# Patient Record
Sex: Male | Born: 1995 | Race: Black or African American | Hispanic: No | Marital: Single
Health system: Southern US, Community
[De-identification: ages and names within clinical notes are randomized; demographics above are authoritative.]

---

## 2016-12-29 ENCOUNTER — Ambulatory Visit
Admission: RE | Admit: 2016-12-29 | Discharge: 2016-12-29 | Disposition: A | Discharge status: Home / Self Care | Payer: Federal, State, Local not specified - PPO | Source: Ambulatory Visit | Attending: Family Medicine | Admitting: Family Medicine

## 2016-12-29 ENCOUNTER — Other Ambulatory Visit: Payer: Self-pay | Admitting: Family Medicine

## 2016-12-29 DIAGNOSIS — M47812 Spondylosis without myelopathy or radiculopathy, cervical region: Secondary | ICD-10-CM | POA: Diagnosis not present

## 2016-12-29 DIAGNOSIS — M2578 Osteophyte, vertebrae: Secondary | ICD-10-CM | POA: Insufficient documentation

## 2016-12-29 DIAGNOSIS — M542 Cervicalgia: Secondary | ICD-10-CM | POA: Diagnosis present

## 2016-12-29 DIAGNOSIS — R52 Pain, unspecified: Secondary | ICD-10-CM

## 2016-12-30 ENCOUNTER — Ambulatory Visit
Admission: RE | Admit: 2016-12-30 | Discharge: 2016-12-30 | Disposition: A | Discharge status: Home / Self Care | Payer: Federal, State, Local not specified - PPO | Source: Ambulatory Visit | Attending: Family Medicine | Admitting: Family Medicine

## 2016-12-30 ENCOUNTER — Other Ambulatory Visit: Payer: Self-pay | Admitting: Family Medicine

## 2016-12-30 ENCOUNTER — Ambulatory Visit (INDEPENDENT_AMBULATORY_CARE_PROVIDER_SITE_OTHER): Payer: Federal, State, Local not specified - PPO | Admitting: Family Medicine

## 2016-12-30 DIAGNOSIS — M4802 Spinal stenosis, cervical region: Secondary | ICD-10-CM | POA: Diagnosis not present

## 2016-12-30 DIAGNOSIS — M5030 Other cervical disc degeneration, unspecified cervical region: Secondary | ICD-10-CM | POA: Diagnosis present

## 2016-12-30 DIAGNOSIS — M50222 Other cervical disc displacement at C5-C6 level: Secondary | ICD-10-CM | POA: Diagnosis not present

## 2016-12-30 DIAGNOSIS — M542 Cervicalgia: Secondary | ICD-10-CM | POA: Diagnosis not present

## 2016-12-30 MED ORDER — NAPROXEN 500 MG PO TABS: 500.0000 mg | ORAL_TABLET | Freq: Two times a day (BID) | ORAL | 0 refills | Status: AC

## 2016-12-30 MED ORDER — CYCLOBENZAPRINE HCL 5 MG PO TABS: 5.0000 mg | ORAL_TABLET | Freq: Two times a day (BID) | ORAL | 0 refills | Status: AC | PRN

## 2017-01-19 NOTE — Progress Notes (Signed)
Patient presents today for symptoms of neck pain. Patient states that he developed neck pain and decreased range of motion the night after the [employer] football game. Patient remembers getting tackled from the left side and falling to the ground. He states that he also twisted his ankle in the process. He believes that his extremities went numb for a few seconds before the training staff came out to the field. He denied any headache or neck pain out on the field. When he was evaluated by myself, Dr. Ardine Engiehl and the training staff he was just complaining about his left ankle. Patient had no neck tenderness, full sensation and strength of all extremities prior to getting up and going to the sidelines. Patient was further evaluated on the sidelines for a neck injury/concussion. Patient had no neck pain or concussive symptoms on sideline evaluation. Patient continued to have some left ankle pain and was unable to return back to the game. Patient reported to the training staff in the morning with neck discomfort and decreased range of motion. Patient denies any headache, blurry vision, nausea, vomiting, decreased strength of extremities or radicular symptoms of extremities. He states that on occasion after a football game in the past he would have neck discomfort but he would not have pain with range of motion as he does now. His ankle symptoms have improved and has minimal pain now and has stopped using the walking boot and is now walking in a tennis shoe.  ROS: Negative except mentioned above.  GENERAL: NAD, making good eye contact, normal affect HEENT: PERRL, EOMI RESP: CTA B CARD: RRR MSK: Mild paravertebral tenderness lower cervical spine/upper thoracic area, mild spasm in this area, mildly decreased flexion due to pain, normal side to side rotation, negative Spurling's, 5 out of 5 strength of upper and lower extremities, normal reflexes NEURO: CN II-XII grossly intact, negative Romberg's, no nystagmus  appreciated  A/P: Neck pain/spasm, C-spine DJD - given patient's symptoms above and C-spine x-ray results would recommend doing an MRI of the C-spine, will then have patient evaluated by Dr. Ardine Engiehl. Will prescribe patient anti-inflammatory medication and muscle relaxant. Discussed doing range of motion exercises with patient and trainer. Ice/heat when necessary. Would not recommend any physical activity for now until MRI C-spine results have been reviewed. Patient states that he will discuss our conversation today with his parents. I told him I would be happy to talk to them if he requested.

## 2018-06-13 IMAGING — MR MR CERVICAL SPINE W/O CM
5 series · 33 of 48 positions shown · non-contrast
Comparison: Cervical spine radiograph 12/29/2016

CLINICAL DATA: Football injury

EXAM:
MRI CERVICAL SPINE WITHOUT CONTRAST
TECHNIQUE: Multiplanar, multisequence MR imaging of the cervical spine was
performed. No intravenous contrast was administered.

[Series 3: T2 · sagittal · 3.0mm · 0.70mm/px · 6 of 13 slices shown (1 of 2)]
[im 1/13]
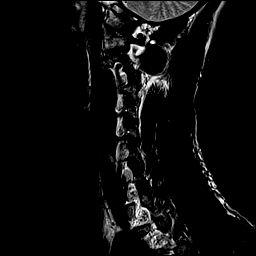
[im 3/13]
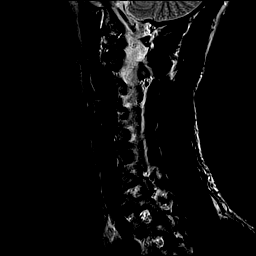
[im 5/13]
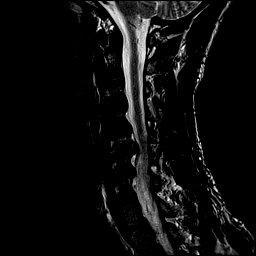
[im 8/13]
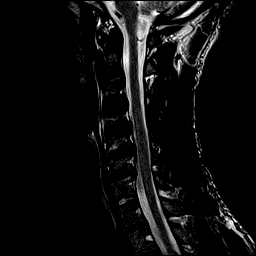
[im 10/13]
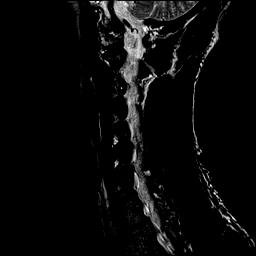
[im 13/13]
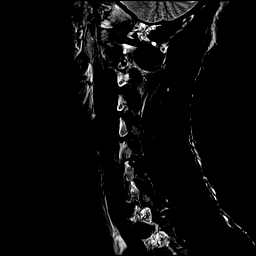

[Series 4: T1 · sagittal · 3.0mm · 0.70mm/px · 7 of 13 slices shown]
[im 1/13]
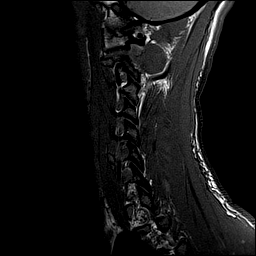
[im 3/13]
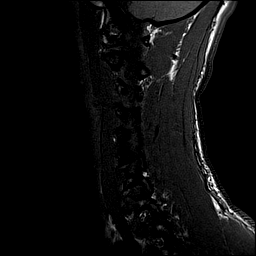
[im 5/13]
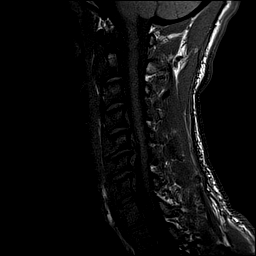
[im 7/13]
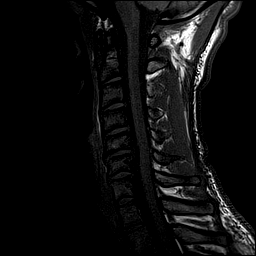
[im 9/13]
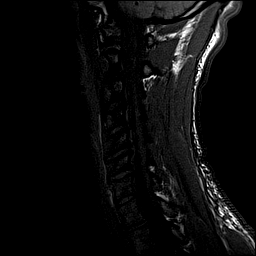
[im 11/13]
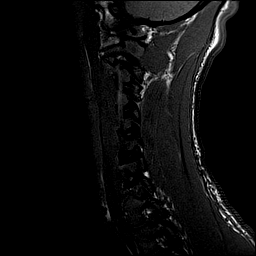
[im 13/13]
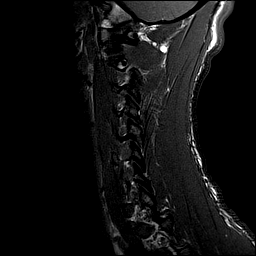

[Series 5: STIR · sagittal · 3.0mm · 0.35mm/px · 7 of 13 slices shown]
[im 1/13]
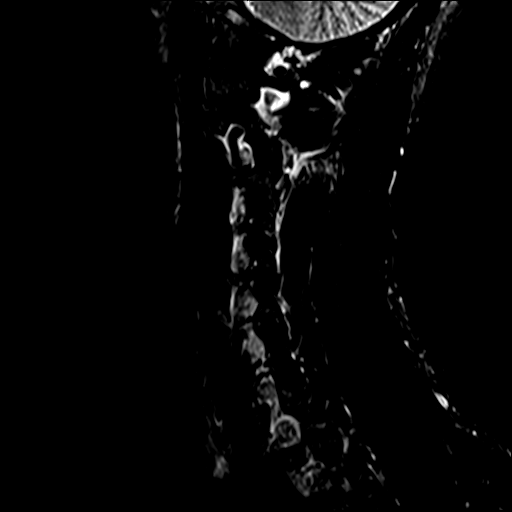
[im 3/13]
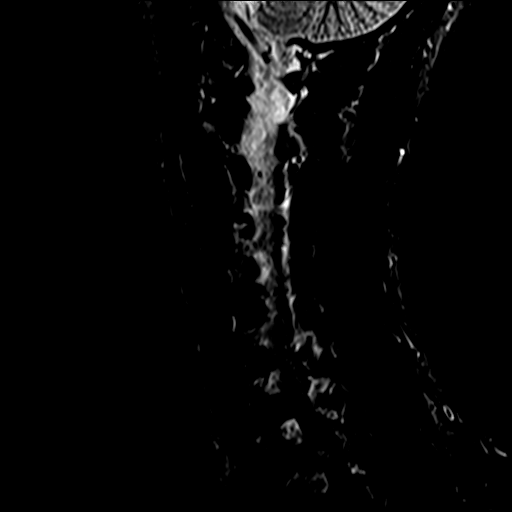
[im 5/13]
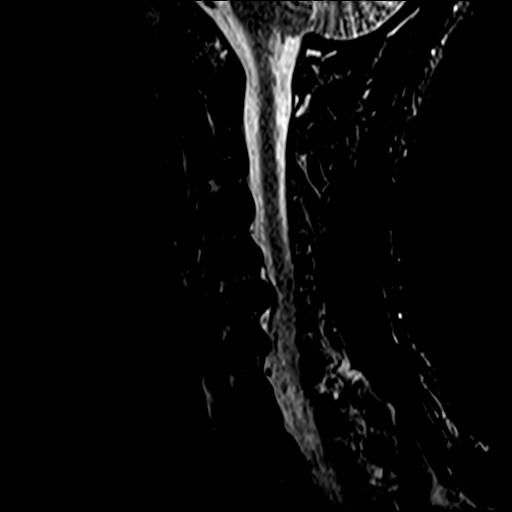
[im 7/13]
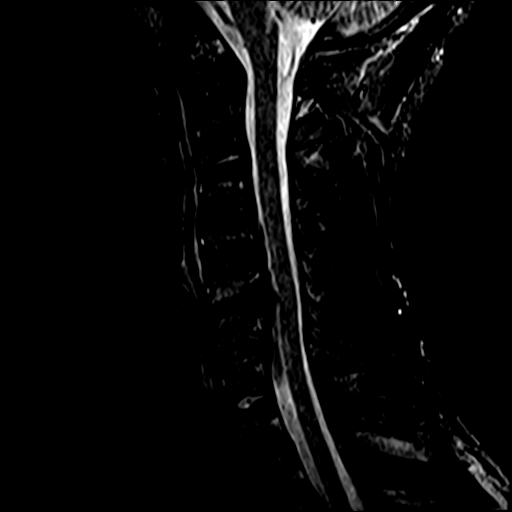
[im 9/13]
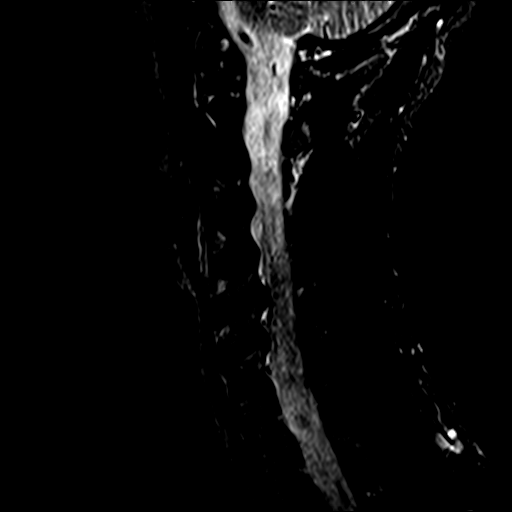
[im 11/13]
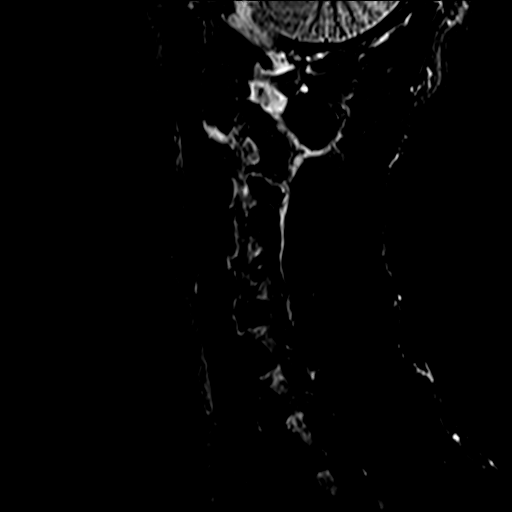
[im 13/13]
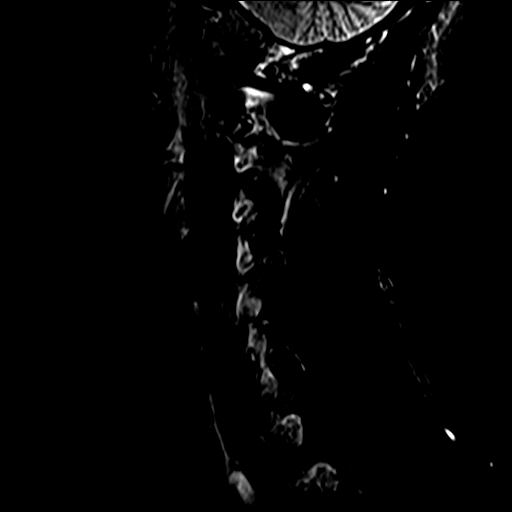

[Series 6: T2 · axial · 3.0mm · 0.70mm/px · z∈[-70,+32]mm · 8 of 28 slices shown (2 of 2)]
[im 1/28]
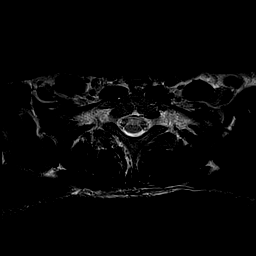
[im 5/28]
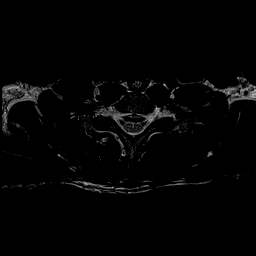
[im 9/28]
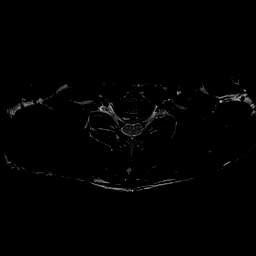
[im 13/28]
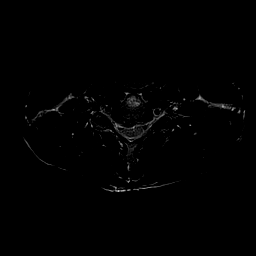
[im 15/28]
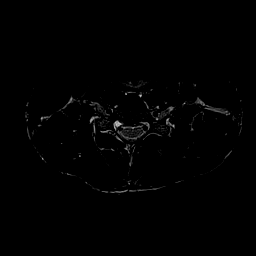
[im 19/28]
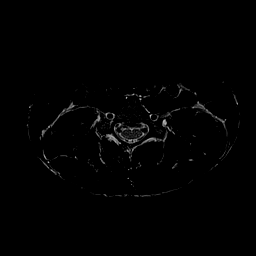
[im 23/28]
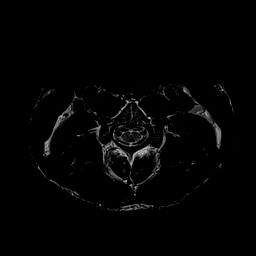
[im 28/28]
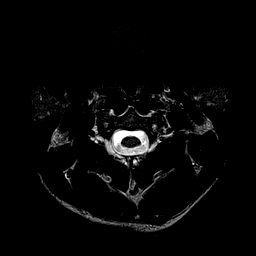

[Series 7: mpgr ax · axial · 3.0mm · 0.35mm/px · z∈[-70,-17]mm · 5 of 28 slices shown]
[im 1/28]
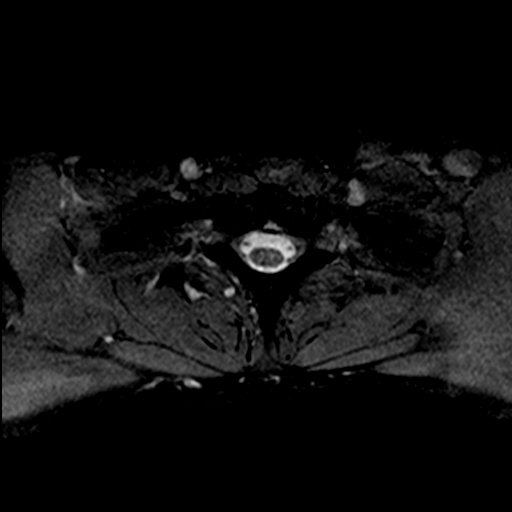
[im 5/28]
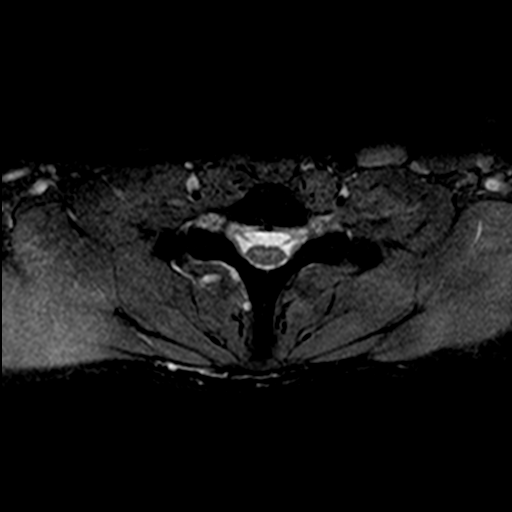
[im 9/28]
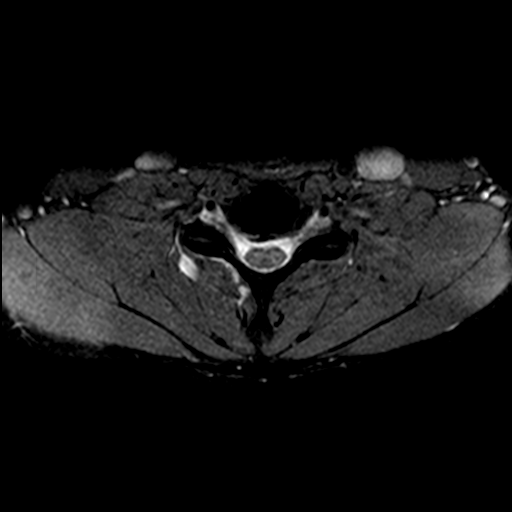
[im 13/28]
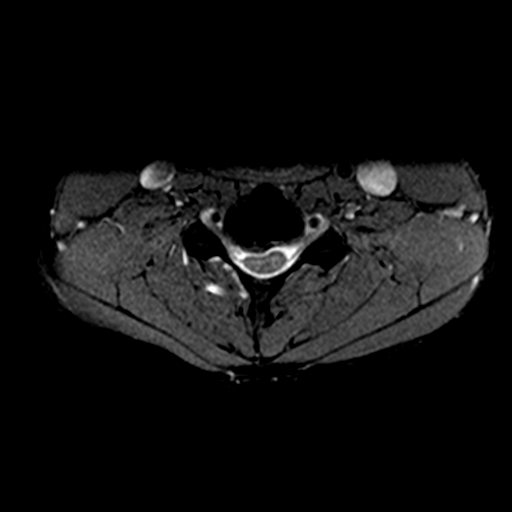
[im 15/28]
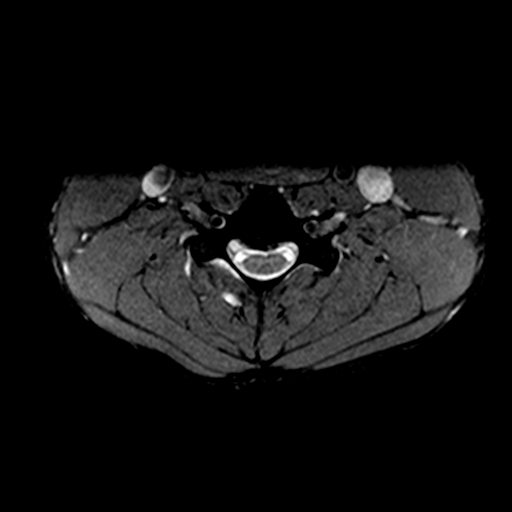

[33 of 48 positions shown; findings below may reference images not displayed]

FINDINGS: Alignment: No static subluxation. There is straightening of the
normal cervical lordosis.

Vertebrae: No fracture, evidence of discitis, or bone lesion.

Cord: Normal signal.

Posterior Fossa, vertebral arteries, paraspinal tissues: Visualized
posterior fossa is normal. Vertebral artery flow voids are
preserved. Normal visualized paraspinal soft tissues.

Disc levels:

C1-C2: Normal.

C2-C3: Normal disc space and facets. No spinal canal or
neuroforaminal stenosis.

C3-C4: Normal disc space and facets. No spinal canal or
neuroforaminal stenosis.

C4-C5: Small left subarticular disc protrusion without associated
stenosis.

C5-C6: Large right subarticular disc extrusion with superior
migration to the infrapedicle C5 level exerts mass effect on the
right hemicord, with moderate spinal canal stenosis. No cord signal
change. No neural foraminal stenosis. There also large uncovertebral
and endplate osteophytes at this level.

C6-C7: Disc desiccation without herniation. No spinal canal or
neural foraminal stenosis.

C7-T1: Normal disc space and facets. No spinal canal or
neuroforaminal stenosis.
IMPRESSION: 1. Large right subarticular C5-C6 disc extrusion with superior
migration to the infrapedicle C5 level causing moderate central
spinal canal stenosis and exerting mass effect on the right half of
the spinal cord without associated signal change. This is probably a
traumatic disc herniation superimposed on chronic endplate and
uncovertebral osteophytosis.
2. Small left C4-5 subarticular disc protrusion without stenosis.
